# Patient Record
Sex: Female | Born: 1935 | Race: White | Hispanic: No | Marital: Single
Health system: Southern US, Community
[De-identification: ages and names within clinical notes are randomized; demographics above are authoritative.]

---

## 2019-08-05 ENCOUNTER — Emergency Department (HOSPITAL_COMMUNITY)
Admission: EM | Admit: 2019-08-05 | Discharge: 2019-08-06 | Disposition: A | Discharge status: Home / Self Care | Payer: Medicare Other | Attending: Emergency Medicine | Admitting: Emergency Medicine

## 2019-08-05 ENCOUNTER — Emergency Department (HOSPITAL_COMMUNITY): Payer: Medicare Other

## 2019-08-05 ENCOUNTER — Other Ambulatory Visit: Payer: Self-pay

## 2019-08-05 DIAGNOSIS — R10815 Periumbilic abdominal tenderness: Secondary | ICD-10-CM | POA: Diagnosis not present

## 2019-08-05 DIAGNOSIS — N390 Urinary tract infection, site not specified: Secondary | ICD-10-CM

## 2019-08-05 DIAGNOSIS — J9611 Chronic respiratory failure with hypoxia: Secondary | ICD-10-CM | POA: Insufficient documentation

## 2019-08-05 DIAGNOSIS — U071 COVID-19: Secondary | ICD-10-CM | POA: Diagnosis not present

## 2019-08-05 DIAGNOSIS — J449 Chronic obstructive pulmonary disease, unspecified: Secondary | ICD-10-CM | POA: Diagnosis not present

## 2019-08-05 DIAGNOSIS — R0789 Other chest pain: Secondary | ICD-10-CM | POA: Diagnosis present

## 2019-08-05 LAB — COMPREHENSIVE METABOLIC PANEL
ALT: 10 U/L (ref 0–44)
AST: 13 U/L — ABNORMAL LOW (ref 15–41)
Albumin: 3.2 g/dL — ABNORMAL LOW (ref 3.5–5.0)
Alkaline Phosphatase: 74 U/L (ref 38–126)
Anion gap: 9 (ref 5–15)
BUN: 9 mg/dL (ref 8–23)
CO2: 29 mmol/L (ref 22–32)
Calcium: 9.7 mg/dL (ref 8.9–10.3)
Chloride: 99 mmol/L (ref 98–111)
Creatinine, Ser: 0.42 mg/dL — ABNORMAL LOW (ref 0.44–1.00)
GFR calc Af Amer: >60 mL/min (ref >60)
GFR calc non Af Amer: >60 mL/min (ref >60)
Glucose, Bld: 136 mg/dL — ABNORMAL HIGH (ref 70–99)
Potassium: 4 mmol/L (ref 3.5–5.1)
Sodium: 137 mmol/L (ref 135–145)
Total Bilirubin: 0.2 mg/dL — ABNORMAL LOW (ref 0.3–1.2)
Total Protein: 6 g/dL — ABNORMAL LOW (ref 6.5–8.1)

## 2019-08-05 LAB — CBC WITH DIFFERENTIAL/PLATELET
Abs Immature Granulocytes: 0.04 10*3/uL (ref 0.00–0.07)
Basophils Absolute: 0 10*3/uL (ref 0.0–0.1)
Basophils Relative: 0 %
Eosinophils Absolute: 0 10*3/uL (ref 0.0–0.5)
Eosinophils Relative: 0 %
HCT: 42.2 % (ref 36.0–46.0)
Hemoglobin: 14.4 g/dL (ref 12.0–15.0)
Immature Granulocytes: 1 %
Lymphocytes Relative: 5 %
Lymphs Abs: 0.5 10*3/uL — ABNORMAL LOW (ref 0.7–4.0)
MCH: 31.8 pg (ref 26.0–34.0)
MCHC: 34.1 g/dL (ref 30.0–36.0)
MCV: 93.2 fL (ref 80.0–100.0)
Monocytes Absolute: 0.3 10*3/uL (ref 0.1–1.0)
Monocytes Relative: 3 %
Neutro Abs: 7.9 10*3/uL — ABNORMAL HIGH (ref 1.7–7.7)
Neutrophils Relative %: 91 %
Platelets: 216 10*3/uL (ref 150–400)
RBC: 4.53 MIL/uL (ref 3.87–5.11)
RDW: 12.5 % (ref 11.5–15.5)
WBC: 8.7 10*3/uL (ref 4.0–10.5)
nRBC: 0 % (ref 0.0–0.2)

## 2019-08-05 LAB — LACTATE DEHYDROGENASE: LDH: 121 U/L (ref 98–192)

## 2019-08-05 LAB — LACTIC ACID, PLASMA: Lactic Acid, Venous: 1.2 mmol/L (ref 0.5–1.9)

## 2019-08-05 LAB — FERRITIN: Ferritin: 149 ng/mL (ref 11–307)

## 2019-08-05 LAB — TRIGLYCERIDES: Triglycerides: 68 mg/dL (ref <150)

## 2019-08-05 LAB — C-REACTIVE PROTEIN: CRP: 1.7 mg/dL — ABNORMAL HIGH (ref <1.0)

## 2019-08-05 LAB — D-DIMER, QUANTITATIVE: D-Dimer, Quant: 0.51 ug/mL-FEU — ABNORMAL HIGH (ref 0.00–0.50)

## 2019-08-05 LAB — FIBRINOGEN: Fibrinogen: 504 mg/dL — ABNORMAL HIGH (ref 210–475)

## 2019-08-05 MED ORDER — SODIUM CHLORIDE 0.9 % IV SOLN
1000.0000 mL | INTRAVENOUS | Status: DC
  Administered 2019-08-05: 1000 mL via INTRAVENOUS

## 2019-08-05 MED ORDER — DEXAMETHASONE SODIUM PHOSPHATE 10 MG/ML IJ SOLN
10.0000 mg | Freq: Once | INTRAMUSCULAR | Status: AC
  Administered 2019-08-05: 10 mg via INTRAVENOUS
  Filled 2019-08-05: qty 1

## 2019-08-05 NOTE — ED Notes (Signed)
Pt sleeping   She appears comfortable

## 2019-08-05 NOTE — ED Triage Notes (Signed)
Pt brought in by  [facility] from [facility]. Pt presents with c/o lower abdominal pain and chest pain. Pt has hx of COPD, on 2L O2. Pt recently diagnosed with COVID. Pt has hx of dementia, is A+Ox1, to person, at baseline. Per staff at facility, abdominal distention noted that is new from yesterday. Pt denies CP at this time. Pt has DNR form at bedside.

## 2019-08-05 NOTE — ED Provider Notes (Signed)
11:37 PM  Assumed care from Dr. Kathrynn Humble.  H/o dementia.  From [facility].  Diagnosed with COVID 4 days ago.  Patient has no complaints now.  On O2 2L here and doing well.  Always on 2L due to COPD.  At nursing home, sats on 3L at SNF were 95% so they sent her to the ED.  Complaining of CP at nursing home.  EMS reports called for abdominal pain.  Denies per nursing home.  Abdominal exam benign here.    Labs unremarkable.  Lactate normal.  Chest x-ray clear.  Age-adjusted D-dimer negative.  Urinalysis pending.  Anticipate discharge back to nursing home.  Previous ED be recommended giving dose of Decadron given questionable slight increase in oxygen requirement at SNF today.  2:30 AM  Tech unable to obtain catheterized urine specimen due to anatomy.  Will encourage oral fluids, check bladder scan and place pure wick.  3:20 AM  Bladder scan reveals over 400 mL in her bladder.  Nurse to attempt catheterization.  3:35 AM  Nurse able to obtain urine.  Reports only approximately 100 mL drained.  I do not feel she is retaining or needs a Foley catheter.  4:05 AM  Pt's urine appears infected.  Urine and blood cultures are pending.  She remains hemodynamically stable and no signs of toxicity, discomfort, hypoxia, increased work of breathing, respiratory distress.  Will give dose of Rocephin here.  No previous urine cultures in our system.  Will discharge back to nursing facility on Keflex.  At this time, I do not feel there is any life-threatening condition present. I have reviewed, interpreted and discussed all results (EKG, imaging, lab, urine as appropriate) and exam findings with patient/family. I have reviewed nursing notes and appropriate previous records.  I feel the patient is safe to be discharged home without further emergent workup and can continue workup as an outpatient as needed. Discussed usual and customary return precautions. Patient/family verbalize understanding and are comfortable with this  plan.  Outpatient follow-up has been provided as needed. All questions have been answered.    Andy Allende, Delice Bison, DO 08/06/19 [phone]

## 2019-08-06 DIAGNOSIS — U071 COVID-19: Secondary | ICD-10-CM | POA: Diagnosis not present

## 2019-08-06 LAB — URINALYSIS, ROUTINE W REFLEX MICROSCOPIC
Bilirubin Urine: NEGATIVE
Glucose, UA: NEGATIVE mg/dL
Hgb urine dipstick: NEGATIVE
Ketones, ur: NEGATIVE mg/dL
Nitrite: NEGATIVE
Protein, ur: NEGATIVE mg/dL
Specific Gravity, Urine: 1.013 (ref 1.005–1.030)
WBC, UA: >50 WBC/hpf — ABNORMAL HIGH (ref 0–5)
pH: 6 (ref 5.0–8.0)

## 2019-08-06 LAB — PROCALCITONIN: Procalcitonin: <0.1 ng/mL

## 2019-08-06 MED ORDER — PROBIOTIC PO CAPS: 1.0000 | ORAL_CAPSULE | Freq: Every day | ORAL | 0 refills | Status: AC

## 2019-08-06 MED ORDER — SODIUM CHLORIDE 0.9 % IV SOLN
1.0000 g | Freq: Once | INTRAVENOUS | Status: AC
  Administered 2019-08-06: 1 g via INTRAVENOUS
  Filled 2019-08-06: qty 10

## 2019-08-06 MED ORDER — CEPHALEXIN 500 MG PO CAPS: 500.0000 mg | ORAL_CAPSULE | Freq: Two times a day (BID) | ORAL | 0 refills | Status: AC

## 2019-08-06 NOTE — Discharge Instructions (Addendum)
Your labs, chest x-ray today were reassuring.  Your chest x-ray was clear.  Please continue your oxygen at your nursing facility.  I recommend that you wear 2 L at all times and this can be increased as needed.  Please continue to isolate for at least 10 days after the onset of symptoms or day of positive Covid test.  You will need to be fever free for at least 3 full days before coming out of isolation.  You did appear to have a urinary tract infection.  We have given you a dose of IV Rocephin and are discharging you home with Keflex.  I recommend taking a probiotic daily while on antibiotics.

## 2019-08-06 NOTE — ED Notes (Signed)
The pts son called to inquire about his mother before he went to sleep    Update given

## 2019-08-06 NOTE — ED Notes (Signed)
CALLED [facility] FOR TRANSPORT BACK TO [facility]--Barbara Newton

## 2019-08-06 NOTE — ED Provider Notes (Signed)
[facility] EMERGENCY DEPARTMENT Provider Note   CSN: [id number] Arrival date & time: 08/05/19  2127     History   Chief Complaint Chief Complaint  Patient presents with  . Abdominal Pain  . Chest Pain    HPI Barbara Newton is a 85 y.o. female.     HPI 85 year old female comes into the ER with chief complaint of abdominal pain, chest pain. Level 5 caveat for dementia.  Patient sent to the ED from [facility].  Patient was diagnosed with COVID-19 4 days ago and complained of chest pain and abdominal pain to the staff.  I called [facility] hospital and they informed me that patient's oxygen saturation had gone down to 95% despite going up to 3 L of oxygen, and therefore the medical staff wanted her to come to the ER for further assessment. Patient normally is on 2 L because of her COPD.  At baseline Barbara Newton has been confused for them.  No past medical history on file.  There are no active problems to display for this patient.    OB History   No obstetric history on file.      Home Medications    Prior to Admission medications   Not on File    Family History No family history on file.  Social History Social History   Tobacco Use  . Smoking status: Not on file  Substance Use Topics  . Alcohol use: Not on file  . Drug use: Not on file     Allergies   Morphine and related and Pollen extract   Review of Systems Review of Systems  Unable to perform ROS: Mental status change     Physical Exam Updated Vital Signs BP 113/69   Pulse (!) 59   Temp (!) 97.5 F (36.4 C) (Oral)   Resp 20   SpO2 100%   Physical Exam Vitals signs and nursing note reviewed.  Constitutional:      Appearance: Barbara Newton is well-developed.  HENT:     Head: Normocephalic and atraumatic.  Neck:     Musculoskeletal: Normal range of motion and neck supple.  Cardiovascular:     Rate and Rhythm: Normal rate.  Pulmonary:     Effort: Pulmonary effort is normal.   Abdominal:     General: Bowel sounds are normal.     Tenderness: There is abdominal tenderness in the suprapubic area. There is no guarding or rebound.  Skin:    General: Skin is warm and dry.  Neurological:     Mental Status: Barbara Newton is alert and oriented to person, place, and time.      ED Treatments / Results  Labs (all labs ordered are listed, but only abnormal results are displayed) Labs Reviewed  CBC WITH DIFFERENTIAL/PLATELET - Abnormal; Notable for the following components:      Result Value   Neutro Abs 7.9 (*)    Lymphs Abs 0.5 (*)    All other components within normal limits  COMPREHENSIVE METABOLIC PANEL - Abnormal; Notable for the following components:   Glucose, Bld 136 (*)    Creatinine, Ser 0.42 (*)    Total Protein 6.0 (*)    Albumin 3.2 (*)    AST 13 (*)    Total Bilirubin 0.2 (*)    All other components within normal limits  D-DIMER, QUANTITATIVE (NOT AT [facility]) - Abnormal; Notable for the following components:   D-Dimer, Quant 0.51 (*)    All other components within normal  limits  FIBRINOGEN - Abnormal; Notable for the following components:   Fibrinogen 504 (*)    All other components within normal limits  C-REACTIVE PROTEIN - Abnormal; Notable for the following components:   CRP 1.7 (*)    All other components within normal limits  CULTURE, BLOOD (ROUTINE X 2)  CULTURE, BLOOD (ROUTINE X 2)  URINE CULTURE  LACTIC ACID, PLASMA  PROCALCITONIN  LACTATE DEHYDROGENASE  FERRITIN  TRIGLYCERIDES  URINALYSIS, ROUTINE W REFLEX MICROSCOPIC    EKG EKG Interpretation  Date/Time:  Sunday August 05 2019 21:39:05 EST Ventricular Rate:  64 PR Interval:    QRS Duration: 77 QT Interval:  415 QTC Calculation: 429 R Axis:   57 Text Interpretation: Sinus rhythm ST elevation, consider inferior injury No acute changes No significant change since last tracing Confirmed by Harlem Bula ([id number]) on 08/05/2019 10:04:37 PM   Radiology Dg Chest Port 1 View  Result  Date: 08/05/2019 CLINICAL DATA:  Chest pain, COVID-19 positivity, initial encounter EXAM: PORTABLE CHEST 1 VIEW COMPARISON:  None. FINDINGS: Cardiac shadows within normal limits. Aortic calcifications are seen. The lungs are well aerated bilaterally. No focal infiltrate or sizable effusion is seen. No bony abnormality is noted. IMPRESSION: No acute abnormality noted. Electronically Signed   By: Mark  Lukens M.D.   On: 08/05/2019 22:27    Procedures Procedures (including critical care time)  Medications Ordered in ED Medications  0.9 %  sodium chloride infusion (1,000 mLs Intravenous New Bag/Given 08/05/19 2228)  dexamethasone (DECADRON) injection 10 mg (10 mg Intravenous Given 08/05/19 2351)     Initial Impression / Assessment and Plan / ED Course  I have reviewed the triage vital signs and the nursing notes.  Pertinent labs & imaging results that were available during my care of the patient were reviewed by me and considered in my medical decision making (see chart for details).        85  year old female comes into the ER with chief complaint of chest pain and abdominal pain.  Patient denies chest pain to me.  Barbara Newton also denies abdominal pain but on exam Barbara Newton has mild tenderness over the lower quadrants.  There is certainly no peritoneal findings.  Upon calling the nursing home they informed me that the primary reason they called EMS was because despite going up from 2 to 3 L of oxygen her O2 sats were still 95%.  Patient has COPD history and is on 2 L of oxygen all the time.  Barbara Newton was recently diagnosed with COVID-19.  Our exam overall is reassuring.  We will get basic labs to support the exam findings.  I do not think patient needs a CT scan of her abdomen and pelvis at this time.   Her care will be signed out to the incoming team.  If Barbara Newton continues to have reassuring labs, and no worsening respiratory status then Barbara Newton will be discharged.  Barbara Newton was evaluated in Emergency  Department on 08/06/2019 for the symptoms described in the history of present illness. Barbara Newton was evaluated in the context of the global COVID-19 pandemic, which necessitated consideration that the patient might be at risk for infection with the SARS-CoV-2 virus that causes COVID-19. Institutional protocols and algorithms that pertain to the evaluation of patients at risk for COVID-19 are in a state of rapid change based on information released by regulatory bodies including the CDC and federal and state organizations. These policies and algorithms were followed during the patient's care in the ED.  Final Clinical Impressions(s) / ED Diagnoses   Final diagnoses:  COVID-19  Chronic respiratory failure with hypoxia [facility](HCC)    ED Discharge Orders    None       Derwood KaplanNanavati, Rafi Kenneth, MD 08/06/19 0045

## 2019-08-06 NOTE — ED Notes (Signed)
Pt given water 

## 2019-08-06 NOTE — ED Notes (Signed)
Ptar called to transport back to [facility]

## 2019-08-06 NOTE — ED Notes (Signed)
Report called to [place] at [facility]

## 2019-08-08 LAB — URINE CULTURE: Culture: 60000 — AB

## 2019-08-09 ENCOUNTER — Telehealth (HOSPITAL_COMMUNITY): Payer: Self-pay | Admitting: Pharmacist

## 2019-08-09 ENCOUNTER — Telehealth: Payer: Self-pay | Admitting: Emergency Medicine

## 2019-08-09 NOTE — Progress Notes (Signed)
ED Antimicrobial Stewardship Positive Culture Follow Up   Barbara Newton is an 85 y.o. female who presented to [facility] on (Not on file) with a chief complaint of No chief complaint on file.   Recent Results (from the past 720 hour(s))  Blood Culture (routine x 2)     Status: None (Preliminary result)   Collection Time: 08/05/19 10:30 PM   Specimen: BLOOD RIGHT FOREARM  Result Value Ref Range Status   Specimen Description BLOOD RIGHT FOREARM  Final   Special Requests   Final    BOTTLES DRAWN AEROBIC AND ANAEROBIC Blood Culture results may not be optimal due to an excessive volume of blood received in culture bottles   Culture   Final    NO GROWTH 2 DAYS Performed at [facility], [place] [address] [place], [place] [address]    Report Status PENDING  Incomplete  Blood Culture (routine x 2)     Status: None (Preliminary result)   Collection Time: 08/05/19 10:38 PM   Specimen: BLOOD  Result Value Ref Range Status   Specimen Description BLOOD RIGHT ANTECUBITAL  Final   Special Requests   Final    BOTTLES DRAWN AEROBIC AND ANAEROBIC Blood Culture results may not be optimal due to an excessive volume of blood received in culture bottles   Culture   Final    NO GROWTH 2 DAYS Performed at [facility], [place] [address], [place], [place] [address]    Report Status PENDING  Incomplete  Urine culture     Status: Abnormal   Collection Time: 08/06/19  3:39 AM   Specimen: Urine, Catheterized  Result Value Ref Range Status   Specimen Description URINE, CATHETERIZED  Final   Special Requests NONE  Final   Culture (A)  Final    60,000 COLONIES/mL PROTEUS MIRABILIS Susceptibility Pattern Suggests Possibility of an Extended Spectrum Beta Lactamase Producer. Contact Laboratory Within 7 Days if Confirmation Warranted. Performed at [facility], [place] [address] [place],  [address]    Report Status 08/08/2019 FINAL  Final   Organism ID, Bacteria PROTEUS MIRABILIS (A)   Final      Susceptibility   Proteus mirabilis - MIC*    AMPICILLIN >=32 RESISTANT Resistant     CEFAZOLIN >=64 RESISTANT Resistant     CEFTRIAXONE >=64 RESISTANT Resistant     CIPROFLOXACIN >=4 RESISTANT Resistant     GENTAMICIN <=1 SENSITIVE Sensitive     IMIPENEM >=16 RESISTANT Resistant     NITROFURANTOIN 128 RESISTANT Resistant     TRIMETH/SULFA >=320 RESISTANT Resistant     AMPICILLIN/SULBACTAM >=32 RESISTANT Resistant     PIP/TAZO <=4 SENSITIVE Sensitive     * 60,000 COLONIES/mL PROTEUS MIRABILIS    [x]  Treated with Cephalexin, organism resistant to prescribed antimicrobial []  Patient discharged originally without antimicrobial agent and treatment is now indicated  New antibiotic prescription: Fosfomycin 3 g PO x1  ED Provider: Richardson Dopp, PA-C   MastersJake Church 08/09/2019, 10:06 AM Clinical Pharmacist Monday - Friday phone -  [phone] Saturday - Sunday phone - [phone]

## 2019-08-09 NOTE — Telephone Encounter (Signed)
Post ED Visit - Positive Culture Follow-up: Successful Patient Follow-Up  Culture assessed and recommendations reviewed by:  []  Elenor Quinones, Pharm.D. []  Heide Guile, Pharm.D., BCPS AQ-ID []  Parks Neptune, Pharm.D., BCPS []  Alycia Rossetti, Pharm.D., BCPS []  [place], [place].D., BCPS, AAHIVP []  Legrand Como, Pharm.D., BCPS, AAHIVP []  Salome Arnt, PharmD, BCPS []  Johnnette Gourd, PharmD, BCPS [x]  Hughes Better, PharmD, BCPS []  Leeroy Cha, PharmD  Positive urine culture  []  Patient discharged without antimicrobial prescription and treatment is now indicated [x]  Organism is resistant to prescribed ED discharge antimicrobial []  Patient with positive blood cultures  Changes discussed with ED provider: Lajean Saver, MD New antibiotic prescription: Fosfomycin 3 gram x one dose Called to [facility] - faxed [phone]  [facility] patient's nurse [facility] [employer], date 08/09/2019, time [place] 08/09/2019, 2:34 PM

## 2019-08-11 LAB — CULTURE, BLOOD (ROUTINE X 2)
Culture: NO GROWTH
Culture: NO GROWTH

## 2020-04-19 IMAGING — DX DG CHEST 1V PORT
1 series · 1 of 1 positions shown · non-contrast
Comparison: None.

CLINICAL DATA: Chest pain, 2MOTM-YP positivity, initial encounter

EXAM:
PORTABLE CHEST 1 VIEW

[chest]
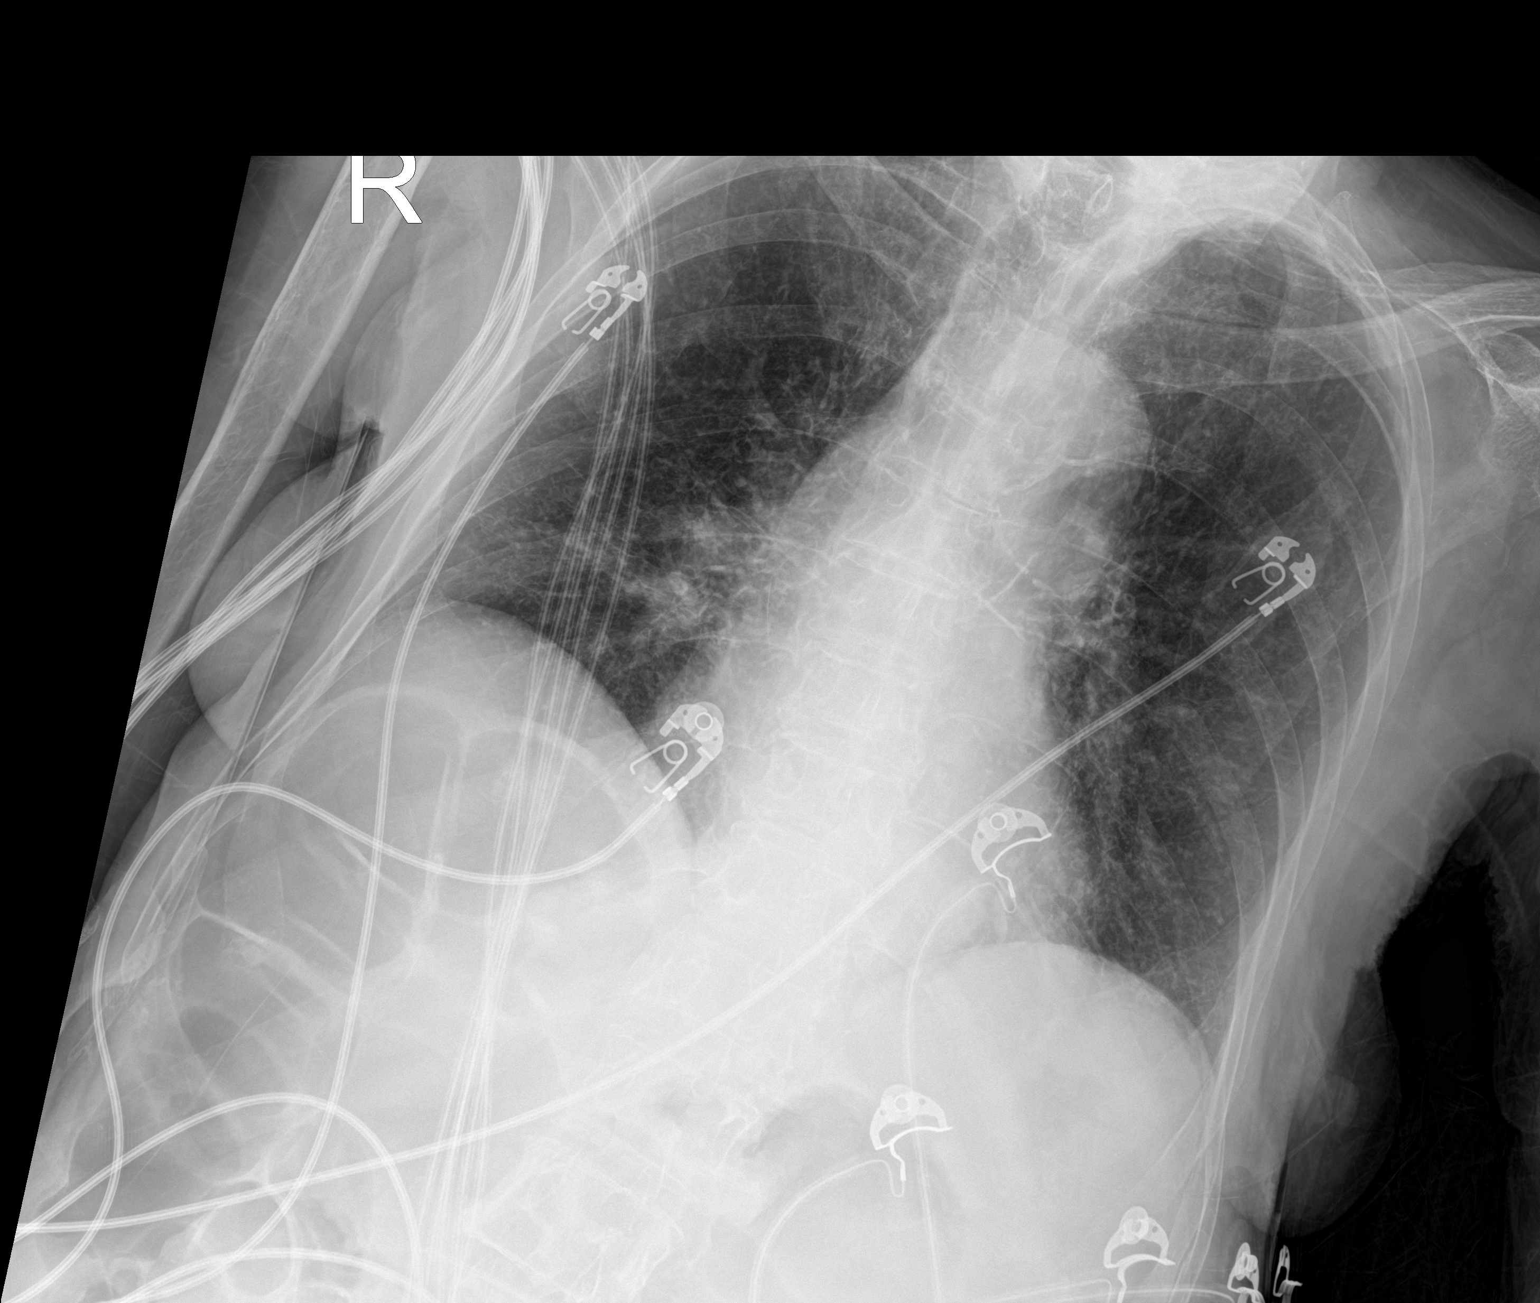

[1 of 1 positions shown; findings below may reference images not displayed]

FINDINGS: Cardiac shadows within normal limits. Aortic calcifications are
seen. The lungs are well aerated bilaterally. No focal infiltrate or
sizable effusion is seen. No bony abnormality is noted.
IMPRESSION: No acute abnormality noted.
